# Patient Record
Sex: Female | Born: 1970 | Race: White | Hispanic: No | Marital: Married | Smoking: Never smoker
Health system: Southern US, Community
[De-identification: ages and names within clinical notes are randomized; demographics above are authoritative.]

---

## 2008-12-12 ENCOUNTER — Ambulatory Visit: Payer: Self-pay | Admitting: Family Medicine

## 2015-12-16 ENCOUNTER — Ambulatory Visit (INDEPENDENT_AMBULATORY_CARE_PROVIDER_SITE_OTHER): Payer: BC Managed Care – PPO | Admitting: Family Medicine

## 2015-12-16 VITALS — BP 108/68 | HR 134 | Temp 99.2°F | Resp 12 | Ht <= 58 in | Wt 96.0 lb

## 2015-12-16 DIAGNOSIS — J0100 Acute maxillary sinusitis, unspecified: Secondary | ICD-10-CM

## 2015-12-16 DIAGNOSIS — J209 Acute bronchitis, unspecified: Secondary | ICD-10-CM

## 2015-12-16 MED ORDER — BENZONATATE 200 MG PO CAPS: 200.0000 mg | ORAL_CAPSULE | Freq: Two times a day (BID) | ORAL | Status: DC | PRN

## 2015-12-16 MED ORDER — AMOXICILLIN-POT CLAVULANATE 500-125 MG PO TABS: 1.0000 | ORAL_TABLET | Freq: Two times a day (BID) | ORAL | Status: DC

## 2015-12-16 NOTE — Progress Notes (Signed)
 @  By signing my name below, I, [name], attest that this documentation has been prepared under the direction and in the presence of [name], MD.  Electronically Signed: Andrew Au, ED Scribe. 12/16/2015. 2:17 PM.  Patient ID: Janice Shepherd MRN: [id number], DOB: 01-Apr-1971, 45 y.o. Date of Encounter: 12/16/2015, 2:17 PM  Primary Physician: No primary care provider on file.  Chief Complaint:  Chief Complaint  Patient presents with   Nasal Congestion    pressure forhead for about a week.   HPI: 45 y.o. year old female with history below presents with nasal congestion. Pt states symptoms started with chills and sweats 1 week ago. This week she's developed sinus congestion, sinus pressures and post nasal drip. She states cough is worse at night when lying down. she has been trying to drink plenty of water but states fluids and food taste bad due to symptoms. She reports sick contacts at school.   No past medical history on file.   Home Meds: Prior to Admission medications   Not on File   Allergies: No Known Allergies  Social History   Social History   Marital Status: Married    Spouse Name: N/A   Number of Children: N/A   Years of Education: N/A   Occupational History   Not on file.   Social History Main Topics   Smoking status: Never Smoker    Smokeless tobacco: Not on file   Alcohol Use: No   Drug Use: No   Sexual Activity: Not on file   Other Topics Concern   Not on file   Social History Narrative   No narrative on file    Review of Systems: Constitutional: negative for chills, fever, night sweats, weight changes, or fatigue  HEENT: negative for vision changes, hearing loss, congestion, rhinorrhea, or epistaxis. Cardiovascular: negative for chest pain or palpitations Respiratory: negative for hemoptysis, wheezing, shortness of breath, or cough Abdominal: negative for abdominal pain, nausea, vomiting, diarrhea, or  constipation Dermatological: negative for rash Neurologic: negative for headache, dizziness, or syncope All other systems reviewed and are otherwise negative with the exception to those above and in the HPI.   Physical Exam: Blood pressure 108/68, pulse 134, temperature 99.2 F (37.3 C), resp. rate 12, height  (1.448 m), weight 96 lb (43.545 kg), last menstrual period 12/08/2015, SpO2 98 %., Body mass index is 20.77 kg/(m^2). General: Well developed, well nourished, in no acute distress. Head: Normocephalic, atraumatic, eyes without discharge, sclera non-icteric, nares are without discharge. Bilateral auditory canals clear, TM's are without perforation, pearly grey and translucent with reflective cone of light bilaterally. Oral cavity moist, posterior pharynx without exudate, erythema, peritonsillar abscess, or post nasal drip.  Neck: Supple. No thyromegaly. Full ROM. No lymphadenopathy. Lungs: Clear bilaterally to auscultation without wheezes, rales, or rhonchi. Breathing is unlabored. Heart: RRR with S1 S2. No murmurs, rubs, or gallops appreciated. HR: 110 Msk:  Strength and tone normal for age. Marked scoliosis with convexity to the right.  Extremities/Skin: Warm and dry. No clubbing or cyanosis. No edema. No rashes or suspicious lesions. Neuro: Alert and oriented X 3. Moves all extremities spontaneously. Gait is normal. CNII-XII grossly in tact. Psych:  Responds to questions appropriately with a normal affect.    ASSESSMENT AND PLAN:  45 y.o. year old female with  This chart was scribed in my presence and reviewed by me personally.    ICD-9-CM ICD-10-CM   1. Acute bronchitis, unspecified organism 466.0 J20.9 amoxicillin-clavulanate (AUGMENTIN) 500-125 MG  tablet     benzonatate (TESSALON) 200 MG capsule  2. Acute maxillary sinusitis, recurrence not specified 461.0 J01.00 amoxicillin-clavulanate (AUGMENTIN) 500-125 MG tablet    Signed, [name], MD 12/16/2015 2:17  PM

## 2015-12-16 NOTE — Patient Instructions (Addendum)
Blood pressure recheck 128/72   Sinusitis, Adult Sinusitis is redness, soreness, and inflammation of the paranasal sinuses. Paranasal sinuses are air pockets within the bones of your face. They are located beneath your eyes, in the middle of your forehead, and above your eyes. In healthy paranasal sinuses, mucus is able to drain out, and air is able to circulate through them by way of your nose. However, when your paranasal sinuses are inflamed, mucus and air can become trapped. This can allow bacteria and other germs to grow and cause infection. Sinusitis can develop quickly and last only a short time (acute) or continue over a long period (chronic). Sinusitis that lasts for more than 12 weeks is considered chronic. CAUSES Causes of sinusitis include:  Allergies.  Structural abnormalities, such as displacement of the cartilage that separates your nostrils (deviated septum), which can decrease the air flow through your nose and sinuses and affect sinus drainage.  Functional abnormalities, such as when the small hairs (cilia) that line your sinuses and help remove mucus do not work properly or are not present. SIGNS AND SYMPTOMS Symptoms of acute and chronic sinusitis are the same. The primary symptoms are pain and pressure around the affected sinuses. Other symptoms include:  Upper toothache.  Earache.  Headache.  Bad breath.  Decreased sense of smell and taste.  A cough, which worsens when you are lying flat.  Fatigue.  Fever.  Thick drainage from your nose, which often is green and may contain pus (purulent).  Swelling and warmth over the affected sinuses. DIAGNOSIS Your health care provider will perform a physical exam. During your exam, your health care provider may perform any of the following to help determine if you have acute sinusitis or chronic sinusitis:  Look in your nose for signs of abnormal growths in your nostrils (nasal polyps).  Tap over the affected sinus  to check for signs of infection.  View the inside of your sinuses using an imaging device that has a light attached (endoscope). If your health care provider suspects that you have chronic sinusitis, one or more of the following tests may be recommended:  Allergy tests.  Nasal culture. A sample of mucus is taken from your nose, sent to a lab, and screened for bacteria.  Nasal cytology. A sample of mucus is taken from your nose and examined by your health care provider to determine if your sinusitis is related to an allergy. TREATMENT Most cases of acute sinusitis are related to a viral infection and will resolve on their own within 10 days. Sometimes, medicines are prescribed to help relieve symptoms of both acute and chronic sinusitis. These may include pain medicines, decongestants, nasal steroid sprays, or saline sprays. However, for sinusitis related to a bacterial infection, your health care provider will prescribe antibiotic medicines. These are medicines that will help kill the bacteria causing the infection. Rarely, sinusitis is caused by a fungal infection. In these cases, your health care provider will prescribe antifungal medicine. For some cases of chronic sinusitis, surgery is needed. Generally, these are cases in which sinusitis recurs more than 3 times per year, despite other treatments. HOME CARE INSTRUCTIONS  Drink plenty of water. Water helps thin the mucus so your sinuses can drain more easily.  Use a humidifier.  Inhale steam 3-4 times a day (for example, sit in the bathroom with the shower running).  Apply a warm, moist washcloth to your face 3-4 times a day, or as directed by your health care provider.  Use saline nasal sprays to help moisten and clean your sinuses.  Take medicines only as directed by your health care provider.  If you were prescribed either an antibiotic or antifungal medicine, finish it all even if you start to feel better. SEEK IMMEDIATE MEDICAL  CARE IF:  You have increasing pain or severe headaches.  You have nausea, vomiting, or drowsiness.  You have swelling around your face.  You have vision problems.  You have a stiff neck.  You have difficulty breathing.   This information is not intended to replace advice given to you by your health care provider. Make sure you discuss any questions you have with your health care provider.   Document Released: 10/14/2005 Document Revised: 11/04/2014 Document Reviewed: 10/29/2011 Elsevier Interactive Patient Education 2016 [employer] Acute Bronchitis Bronchitis is inflammation of the airways that extend from the windpipe into the lungs (bronchi). The inflammation often causes mucus to develop. This leads to a cough, which is the most common symptom of bronchitis.  In acute bronchitis, the condition usually develops suddenly and goes away over time, usually in a couple weeks. Smoking, allergies, and asthma can make bronchitis worse. Repeated episodes of bronchitis may cause further lung problems.  CAUSES Acute bronchitis is most often caused by the same virus that causes a cold. The virus can spread from person to person (contagious) through coughing, sneezing, and touching contaminated objects. SIGNS AND SYMPTOMS   Cough.   Fever.   Coughing up mucus.   Body aches.   Chest congestion.   Chills.   Shortness of breath.   Sore throat.  DIAGNOSIS  Acute bronchitis is usually diagnosed through a physical exam. Your health care provider will also ask you questions about your medical history. Tests, such as chest X-rays, are sometimes done to rule out other conditions.  TREATMENT  Acute bronchitis usually goes away in a couple weeks. Oftentimes, no medical treatment is necessary. Medicines are sometimes given for relief of fever or cough. Antibiotic medicines are usually not needed but may be prescribed in certain situations. In some cases, an inhaler may be recommended  to help reduce shortness of breath and control the cough. A cool mist vaporizer may also be used to help thin bronchial secretions and make it easier to clear the chest.  HOME CARE INSTRUCTIONS  Get plenty of rest.   Drink enough fluids to keep your urine clear or pale yellow (unless you have a medical condition that requires fluid restriction). Increasing fluids may help thin your respiratory secretions (sputum) and reduce chest congestion, and it will prevent dehydration.   Take medicines only as directed by your health care provider.  If you were prescribed an antibiotic medicine, finish it all even if you start to feel better.  Avoid smoking and secondhand smoke. Exposure to cigarette smoke or irritating chemicals will make bronchitis worse. If you are a smoker, consider using nicotine gum or skin patches to help control withdrawal symptoms. Quitting smoking will help your lungs heal faster.   Reduce the chances of another bout of acute bronchitis by washing your hands frequently, avoiding people with cold symptoms, and trying not to touch your hands to your mouth, nose, or eyes.   Keep all follow-up visits as directed by your health care provider.  SEEK MEDICAL CARE IF: Your symptoms do not improve after 1 week of treatment.  SEEK IMMEDIATE MEDICAL CARE IF:  You develop an increased fever or chills.   You have chest pain.  You have severe shortness of breath.  You have bloody sputum.   You develop dehydration.  You faint or repeatedly feel like you are going to pass out.  You develop repeated vomiting.  You develop a severe headache. MAKE SURE YOU:   Understand these instructions.  Will watch your condition.  Will get help right away if you are not doing well or get worse.   This information is not intended to replace advice given to you by your health care provider. Make sure you discuss any questions you have with your health care provider.   Document  Released: 11/21/2004 Document Revised: 11/04/2014 Document Reviewed: 04/06/2013 Elsevier Interactive Patient Education [employer]

## 2018-03-21 ENCOUNTER — Ambulatory Visit (HOSPITAL_COMMUNITY)
Admission: EM | Admit: 2018-03-21 | Discharge: 2018-03-21 | Disposition: A | Discharge status: Home / Self Care | Payer: BC Managed Care – PPO | Attending: Family Medicine | Admitting: Family Medicine

## 2018-03-21 ENCOUNTER — Encounter (HOSPITAL_COMMUNITY): Payer: Self-pay | Admitting: Emergency Medicine

## 2018-03-21 DIAGNOSIS — R198 Other specified symptoms and signs involving the digestive system and abdomen: Secondary | ICD-10-CM | POA: Diagnosis not present

## 2018-03-21 DIAGNOSIS — K6289 Other specified diseases of anus and rectum: Secondary | ICD-10-CM

## 2018-03-21 MED ORDER — HYDROCORTISONE 2.5 % RE CREA: TOPICAL_CREAM | RECTAL | 1 refills | Status: DC

## 2018-03-21 NOTE — ED Provider Notes (Signed)
[facility]   [id number] 03/21/18 Arrival Time: 1713   SUBJECTIVE:  Janice Shepherd is a 47 y.o. 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    History reviewed. No pertinent past medical history. History reviewed. No pertinent family history. Social History   Socioeconomic History  . Marital status: Married    Spouse name: Not on file  . Number of children: Not on file  . Years of education: Not on file  . Highest education level: Not on file  Occupational History  . Not on file  Social Needs  . Financial resource strain: Not on file  . Food insecurity:    Worry: Not on file    Inability: Not on file  . Transportation needs:    Medical: Not on file    Non-medical: Not on file  Tobacco Use  . Smoking status: Never Smoker  Substance and Sexual Activity  . Alcohol use: No    Alcohol/week: 0.0 oz  . Drug use: No  . Sexual activity: Not on file  Lifestyle  . Physical activity:    Days per week: Not on file    Minutes per session: Not on file  . Stress: Not on file  Relationships  . Social connections:    Talks on phone: Not on file    Gets together: Not on file    Attends religious service: Not on file    Active member of club or organization: Not on file    Attends meetings of clubs or organizations: Not on file    Relationship status: Not on file  . Intimate partner violence:    Fear of current or ex partner: Not on file    Emotionally abused: Not on file    Physically abused: Not on file    Forced sexual activity: Not on file  Other Topics Concern  . Not on file  Social History Narrative  . Not on file   No outpatient medications have been marked as taking for the 03/21/18 encounter [facility] Encounter).   No Known Allergies    ROS: As per HPI, remainder  of ROS negative.   OBJECTIVE:   Vitals:   03/21/18 1821  BP: (!) 157/99  Pulse: (!) 120  Resp: 18  Temp: 98.5 F (36.9 C)  TempSrc: Oral  SpO2: 100%     General appearance: alert; no distress Eyes: PERRL; EOMI; conjunctiva normal HENT: normocephalic; atraumatic; oral mucosa normal Neck: supple Lungs: clear to auscultation bilaterally Heart: regular rate and rhythm Abdomen: soft, non-tender; bowel sounds normal; no masses or organomegaly; no guarding or rebound tenderness Back: no CVA tenderness Extremities: no cyanosis or edema; symmetrical with no gross deformities Skin: warm and dry Neurologic: normal gait; grossly normal Psychological: alert and cooperative; normal mood and affect      Labs:  No results found for this or any previous visit.  Labs Reviewed - No data to display  No results found.     ASSESSMENT & PLAN:  1. Change in bowel function   2. Proctitis   Take probiotics with each meal for the next week.  Meds ordered this encounter  Medications  . hydrocortisone (ANUSOL-HC) 2.5 % rectal cream    Sig: Apply rectally 2 times daily    Dispense:  28 g    Refill:  1    Reviewed expectations re: course of current medical issues. Questions answered. Outlined signs and symptoms indicating need for more acute intervention. Patient verbalized understanding. After Visit Summary given.    Procedures:      [name], MD 03/22/18 1139

## 2018-03-21 NOTE — ED Triage Notes (Signed)
Pt here for abd pain and gas.

## 2018-03-21 NOTE — Discharge Instructions (Addendum)
Take probiotics with each meal for the next week.

## 2018-05-11 ENCOUNTER — Other Ambulatory Visit: Payer: Self-pay | Admitting: Student

## 2018-05-11 DIAGNOSIS — R140 Abdominal distension (gaseous): Secondary | ICD-10-CM

## 2018-05-21 ENCOUNTER — Ambulatory Visit
Admission: RE | Admit: 2018-05-21 | Discharge: 2018-05-21 | Disposition: A | Discharge status: Home / Self Care | Payer: BC Managed Care – PPO | Source: Ambulatory Visit | Attending: Student | Admitting: Student

## 2018-05-21 DIAGNOSIS — N281 Cyst of kidney, acquired: Secondary | ICD-10-CM | POA: Diagnosis not present

## 2018-05-21 DIAGNOSIS — R140 Abdominal distension (gaseous): Secondary | ICD-10-CM | POA: Diagnosis not present

## 2018-11-14 ENCOUNTER — Ambulatory Visit (HOSPITAL_COMMUNITY)
Admission: EM | Admit: 2018-11-14 | Discharge: 2018-11-14 | Disposition: A | Discharge status: Home / Self Care | Payer: BC Managed Care – PPO | Attending: Family Medicine | Admitting: Family Medicine

## 2018-11-14 ENCOUNTER — Encounter (HOSPITAL_COMMUNITY): Payer: Self-pay | Admitting: Emergency Medicine

## 2018-11-14 DIAGNOSIS — R059 Cough, unspecified: Secondary | ICD-10-CM

## 2018-11-14 DIAGNOSIS — R0981 Nasal congestion: Secondary | ICD-10-CM | POA: Insufficient documentation

## 2018-11-14 DIAGNOSIS — R05 Cough: Secondary | ICD-10-CM | POA: Insufficient documentation

## 2018-11-14 DIAGNOSIS — R030 Elevated blood-pressure reading, without diagnosis of hypertension: Secondary | ICD-10-CM | POA: Insufficient documentation

## 2018-11-14 MED ORDER — BENZONATATE 100 MG PO CAPS: ORAL_CAPSULE | ORAL | 0 refills | Status: DC

## 2018-11-14 MED ORDER — AZITHROMYCIN 250 MG PO TABS: 250.0000 mg | ORAL_TABLET | Freq: Every day | ORAL | 0 refills | Status: DC

## 2018-11-14 NOTE — ED Triage Notes (Signed)
Pt c/o coughing, congestion, pressure in head x1 week.

## 2018-11-16 NOTE — ED Provider Notes (Signed)
[facility]   [id number] 11/14/18 Arrival Time: 1403  ASSESSMENT & PLAN:  1. Cough   2. Sinus congestion   3. Elevated blood pressure reading in office without diagnosis of hypertension     Meds ordered this encounter  Medications  . azithromycin (ZITHROMAX) 250 MG tablet    Sig: Take 1 tablet (250 mg total) by mouth daily. Take first 2 tablets together, then 1 every day until finished.    Dispense:  6 tablet    Refill:  0  . benzonatate (TESSALON) 100 MG capsule    Sig: Take 1 capsule by mouth every 8 (eight) hours for cough.    Dispense:  21 capsule    Refill:  0   Clinical Course  Sat Nov 14, 2018  1559 Noted. Plans follow up with PCP to recheck. Recheck 143/83.  BP(!): 188/104 [BH]  1610 Pulse Rate(!): 105 [BH]     Written HTN information given.  OTC symptom care as needed. Ensure adequate fluid intake and rest.  Reviewed expectations re: course of current medical issues. Questions answered. Outlined signs and symptoms indicating need for more acute intervention. Patient verbalized understanding. After Visit Summary given.   SUBJECTIVE: History from: patient.  Janice Shepherd is a 48 y.o. female who presents with complaint of nasal congestion, post-nasal drainage, and sinus pain. Also with a mild non-productive cough. Onset gradual, 2 weeks ago; worse over the past week. Respiratory symptoms: cough without wheezing/SOB. Fever: no. Overall normal PO intake without n/v. OTC treatment: cold medicines without much help. Now with more facial/sinus pressure. History of frequent sinus infections: no. No specific aggravating or alleviating factors reported.  Social History   Tobacco Use  Smoking Status Never Smoker   ROS: As per HPI. All other systems negative    OBJECTIVE:  Vitals:   11/14/18 1539  BP: (!) 188/104  Pulse: (!) 105  Resp: 16  Temp: 98.5 F (36.9 C)  SpO2: 100%    Elevated BP noted. Recheck 143/96.  General appearance: alert;  appears fatigued HEENT: nasal congestion; clear runny nose; throat irritation secondary to post-nasal drainage; bilateral maxillary, frontal tenderness to palpation; turbinates boggy Neck: supple without LAD; trachea midline Lungs: unlabored respirations, symmetrical air entry without wheezing; cough: moderate; no respiratory distress Skin: warm and dry Psychological: alert and cooperative; normal mood and affect  No Known Allergies  FH: Questions HTN.  Social History   Socioeconomic History  . Marital status: Married    Spouse name: Not on file  . Number of children: Not on file  . Years of education: Not on file  . Highest education level: Not on file  Occupational History  . Not on file  Social Needs  . Financial resource strain: Not on file  . Food insecurity:    Worry: Not on file    Inability: Not on file  . Transportation needs:    Medical: Not on file    Non-medical: Not on file  Tobacco Use  . Smoking status: Never Smoker  Substance and Sexual Activity  . Alcohol use: No    Alcohol/week: 0.0 standard drinks  . Drug use: No  . Sexual activity: Not on file  Lifestyle  . Physical activity:    Days per week: Not on file    Minutes per session: Not on file  . Stress: Not on file  Relationships  . Social connections:    Talks on phone: Not on file    Gets together: Not on file  Attends religious service: Not on file    Active member of club or organization: Not on file    Attends meetings of clubs or organizations: Not on file    Relationship status: Not on file  . Intimate partner violence:    Fear of current or ex partner: Not on file    Emotionally abused: Not on file    Physically abused: Not on file    Forced sexual activity: Not on file  Other Topics Concern  . Not on file  Social History Narrative  . Not on file            Mardella LaymanHagler, Marlin Brys, MD 11/16/18 [phone]

## 2019-01-02 IMAGING — US US ABDOMEN COMPLETE
1 series · 14 of 25 positions shown · non-contrast
Comparison: None.

CLINICAL DATA: Two months of abdominal bloating.

EXAM:
ABDOMEN ULTRASOUND COMPLETE

[Series 1: us abdomen complete · 14 of 105 slices shown]
[im 1/105]
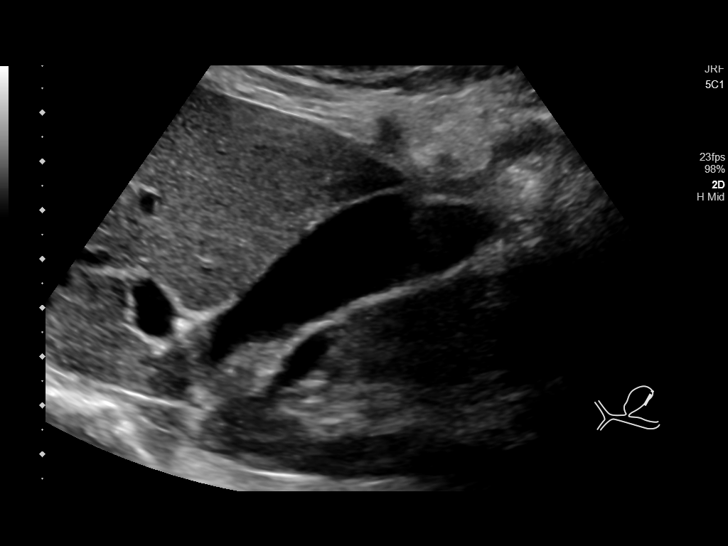
[im 9/105]
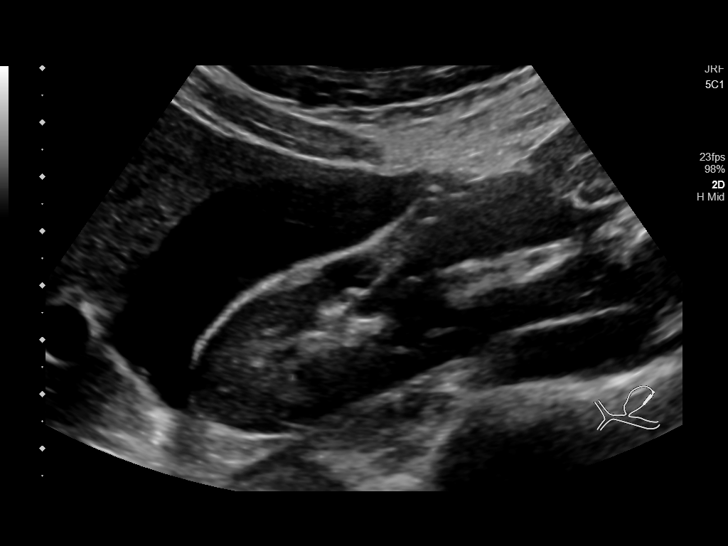
[im 18/105]
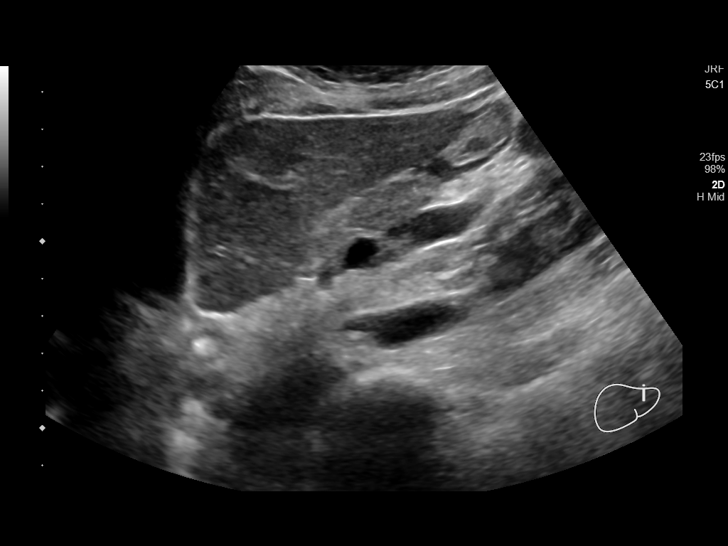
[im 27/105]
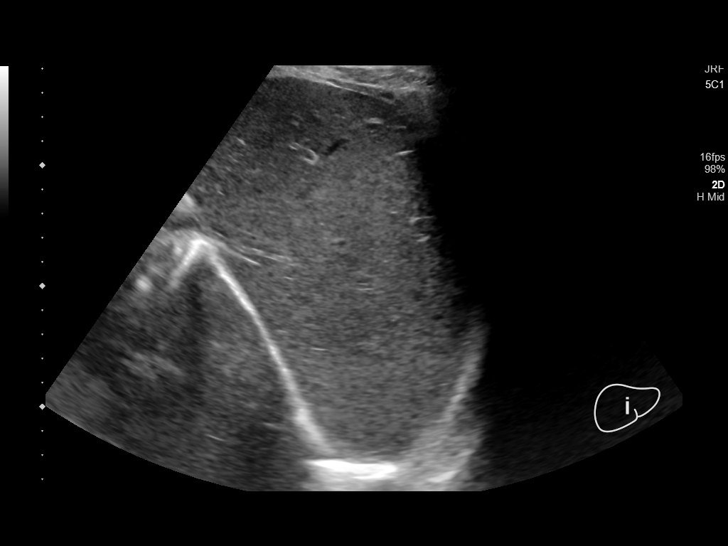
[im 35/105]
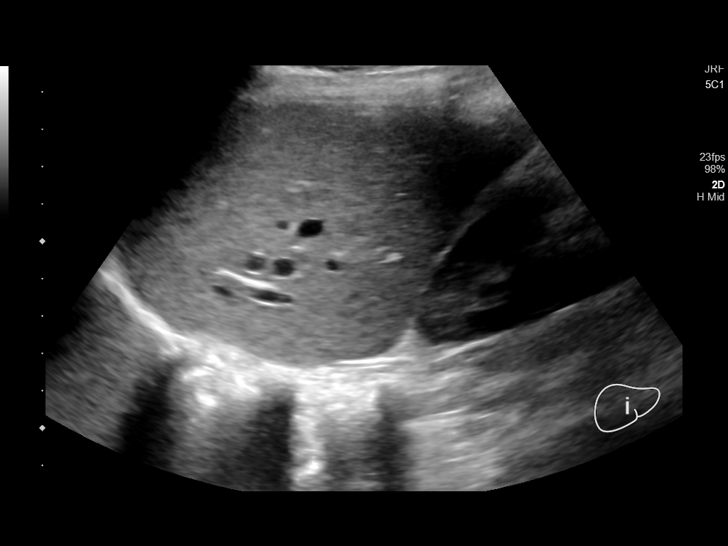
[im 40/105]
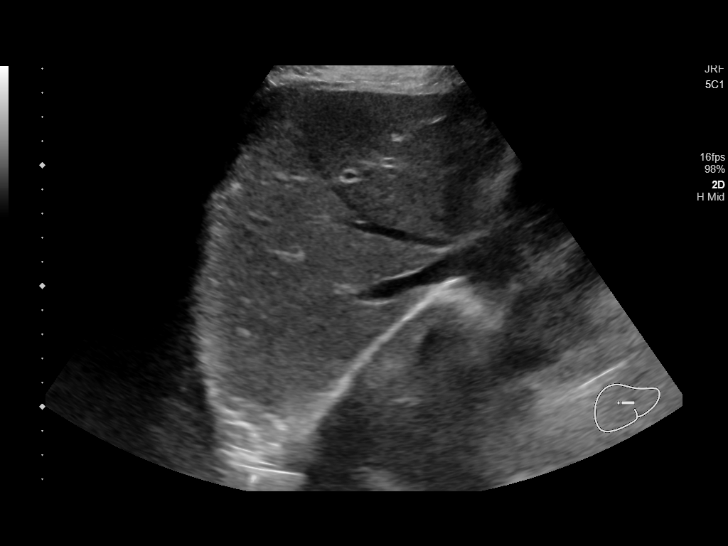
[im 48/105]
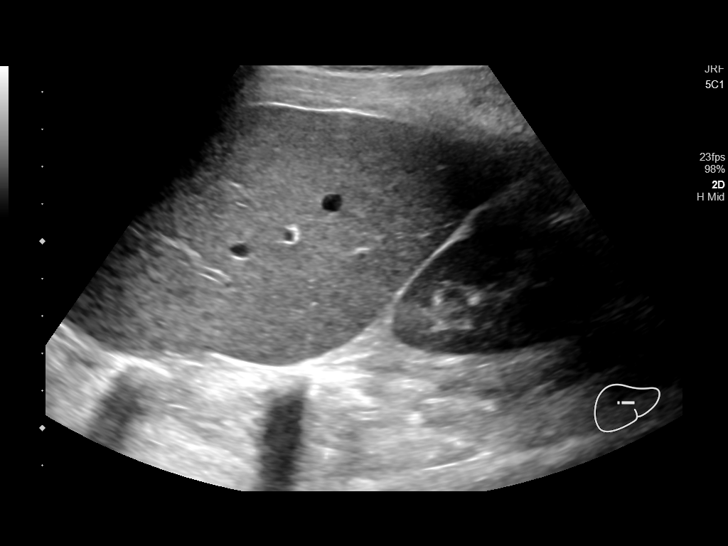
[im 57/105]
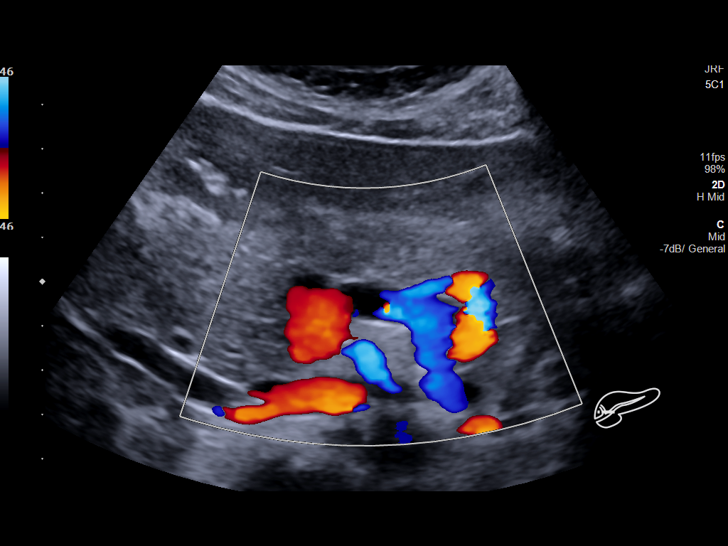
[im 66/105]
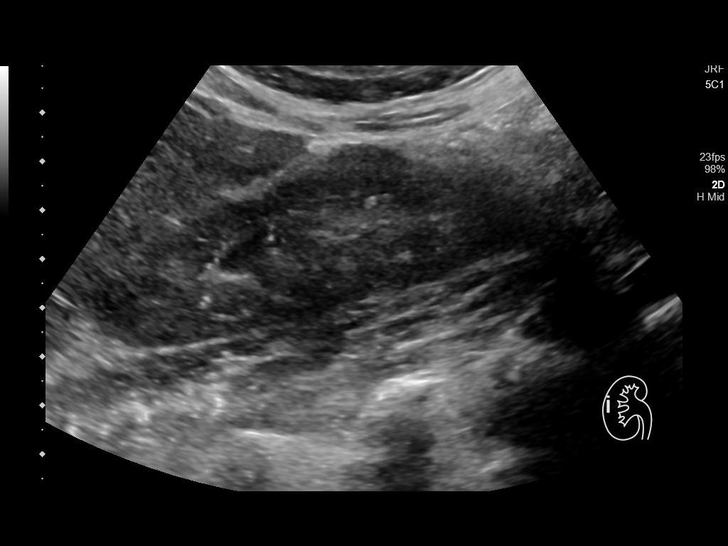
[im 70/105]
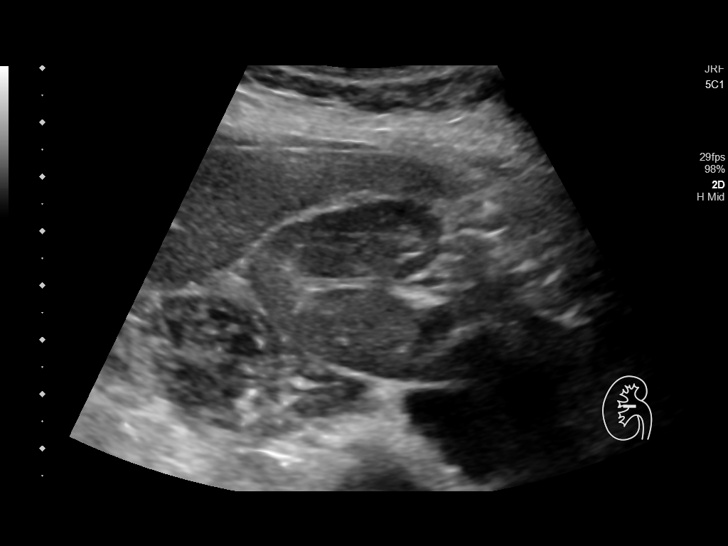
[im 79/105]
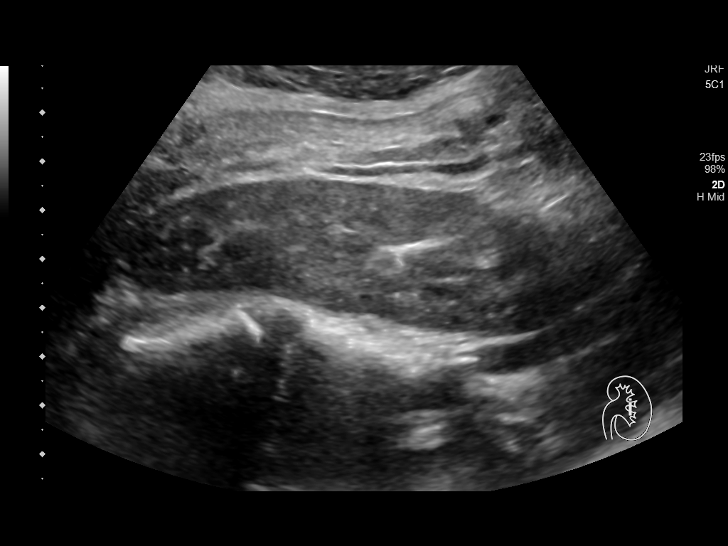
[im 87/105]
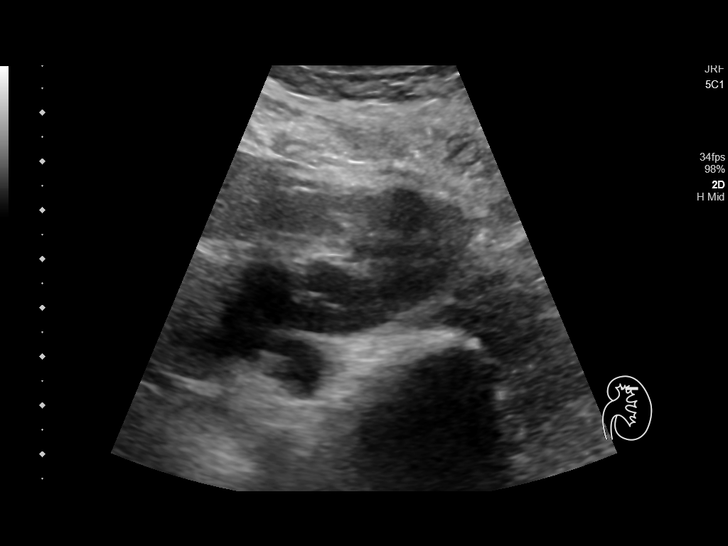
[im 96/105]
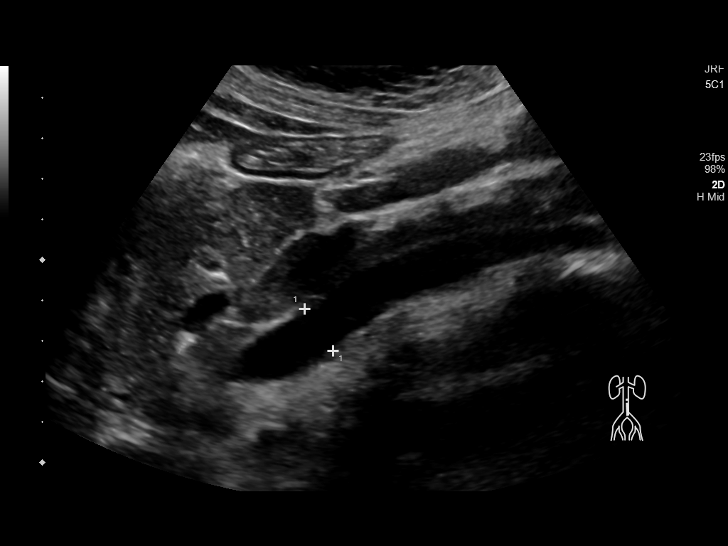
[im 105/105]
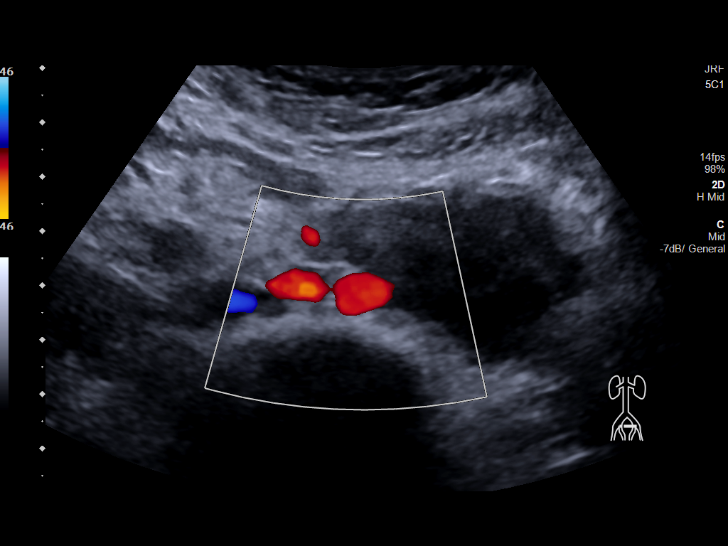

[14 of 25 positions shown; findings below may reference images not displayed]

FINDINGS: Gallbladder: No gallstones or wall thickening visualized. No
sonographic Murphy sign noted by sonographer.

Common bile duct: Diameter: 1.9 mm

Liver: No focal lesion identified. Within normal limits in
parenchymal echogenicity. Portal vein is patent on color Doppler
imaging with normal direction of blood flow towards the liver.

IVC: No abnormality visualized.

Pancreas: Visualized portion unremarkable.

Spleen: Size and appearance within normal limits.

Right Kidney: Length: 10.2 cm. There is a lower pole cyst measuring
2.4 cm in diameter. The renal cortical echotexture remains lower
than that of the liver.

Left Kidney: Length: 9.6 cm. Echogenicity within normal limits. No
mass or hydronephrosis visualized.

Abdominal aorta: No aneurysm visualized.

Other findings: There is no ascites.
IMPRESSION: There is no acute intra-abdominal abnormality. There is a simple
appearing lower pole cyst in the right kidney.

## 2019-06-09 ENCOUNTER — Ambulatory Visit: Payer: Self-pay | Admitting: Physician Assistant

## 2019-10-17 ENCOUNTER — Ambulatory Visit (HOSPITAL_COMMUNITY)
Admission: EM | Admit: 2019-10-17 | Discharge: 2019-10-17 | Disposition: A | Discharge status: Home / Self Care | Payer: BC Managed Care – PPO | Attending: Family Medicine | Admitting: Family Medicine

## 2019-10-17 ENCOUNTER — Other Ambulatory Visit: Payer: Self-pay

## 2019-10-17 ENCOUNTER — Encounter (HOSPITAL_COMMUNITY): Payer: Self-pay

## 2019-10-17 DIAGNOSIS — L219 Seborrheic dermatitis, unspecified: Secondary | ICD-10-CM

## 2019-10-17 MED ORDER — KETOCONAZOLE 2 % EX SHAM: 1.0000 "application " | MEDICATED_SHAMPOO | CUTANEOUS | 0 refills | Status: DC

## 2019-10-17 NOTE — ED Triage Notes (Signed)
Pt states she has some type of scaple issue in the back of her head. This has been going on since last Wednesday , red and burns some.

## 2019-10-17 NOTE — Discharge Instructions (Signed)
Use medicated shampoo 2 x a week May also use a hydrocortisone scalp treatment for itching if needed (scalpicin)  See your PCP if fails to improve

## 2019-10-17 NOTE — ED Provider Notes (Signed)
[place]    CSN: [id number] Arrival date & time: 10/17/19  1116      History   Chief Complaint Chief Complaint  Patient presents with  . Rash    HPI Janice Shepherd is a 48 y.o. female.   HPI  Patient has a rash on her scalp.  It is itching.  She has red bumps and scale.  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  She thinks the children may have given her an infection or infestation.  History reviewed. No pertinent past medical history.  There are no problems to display for this patient.   History reviewed. No pertinent surgical history.  OB History   No obstetric history on file.      Home Medications    Prior to Admission medications   Medication Sig Start Date End Date Taking? Authorizing Provider  ketoconazole (NIZORAL) 2 % shampoo Apply 1 application topically 2 (two) times a week. 10/18/19   Raylene Everts, MD    Family History History reviewed. No pertinent family history.  Social History Social History   Tobacco Use  . Smoking status: Never Smoker  . Smokeless tobacco: Never Used  Substance Use Topics  . Alcohol use: No    Alcohol/week: 0.0 standard drinks  . Drug use: No     Allergies   Patient has no known allergies.   Review of Systems Review of Systems  Constitutional: Negative for chills and fever.  HENT: Negative for congestion and hearing loss.   Eyes: Negative for pain.  Respiratory: Negative for cough and shortness of breath.   Cardiovascular: Negative for chest pain and leg swelling.  Gastrointestinal: Negative for abdominal pain, constipation and diarrhea.  Genitourinary: Negative for dysuria and frequency.  Musculoskeletal: Negative for myalgias.  Skin: Positive for rash.  Neurological: Negative for dizziness, seizures and headaches.  Psychiatric/Behavioral: The patient is not nervous/anxious.      Physical Exam Triage Vital Signs ED Triage Vitals    Enc Vitals Group     BP 10/17/19 1156 (!) 158/84     Pulse Rate 10/17/19 1156 100     Resp 10/17/19 1156 18     Temp 10/17/19 1156 98.3 F (36.8 C)     Temp src --      SpO2 10/17/19 1156 100 %     Weight 10/17/19 1154 104 lb (47.2 kg)     Height --      Head Circumference --      Peak Flow --      Pain Score 10/17/19 1153 0     Pain Loc --      Pain Edu? --      Excl. in [place]? --    No data found.  Updated Vital Signs BP (!) 158/84   Pulse 100   Temp 98.3 F (36.8 C)   Resp 18   Wt 47.2 kg   LMP 09/26/2019   SpO2 100%   BMI 22.51 kg/m      Physical Exam Constitutional:      General: She is not in acute distress.    Appearance: She is well-developed and normal weight.  HENT:     Head: Normocephalic and atraumatic.     Mouth/Throat:     Comments: Mask in place Eyes:     Conjunctiva/sclera: Conjunctivae normal.     Pupils: Pupils are equal, round, and reactive to light.  Cardiovascular:  Rate and Rhythm: Normal rate and regular rhythm.  Pulmonary:     Effort: Pulmonary effort is normal. No respiratory distress.     Breath sounds: Normal breath sounds.  Abdominal:     General: There is no distension.     Palpations: Abdomen is soft.  Musculoskeletal:        General: Normal range of motion.     Cervical back: Normal range of motion.  Skin:    General: Skin is warm and dry.     Comments: Scalp was examined.  There is red small macules around the eye back of the occiput region with scale, scattered up towards the crown  Neurological:     Mental Status: She is alert.      UC Treatments / Results  Labs (all labs ordered are listed, but only abnormal results are displayed) Labs Reviewed - No data to display  EKG   Radiology No results found.  Procedures Procedures (including critical care time)  Medications Ordered in UC Medications - No data to display  Initial Impression / Assessment and Plan / UC Course  I have reviewed the triage vital  signs and the nursing notes.  Pertinent labs & imaging results that were available during my care of the patient were reviewed by me and considered in my medical decision making (see chart for details).     I reassured the patient that this is noninfectious Final Clinical Impressions(s) / UC Diagnoses   Final diagnoses:  Seborrheic dermatitis of scalp     Discharge Instructions     Use medicated shampoo 2 x a week May also use a hydrocortisone scalp treatment for itching if needed (scalpicin)  See your PCP if fails to improve    ED Prescriptions    Medication Sig Dispense Auth. Provider   ketoconazole (NIZORAL) 2 % shampoo  (Status: Discontinued) Apply 1 application topically 2 (two) times a week. 120 mL Eustace Moore, MD   ketoconazole (NIZORAL) 2 % shampoo Apply 1 application topically 2 (two) times a week. 120 mL Eustace Moore, MD     PDMP not reviewed this encounter.   Eustace Moore, MD 10/17/19 [phone]

## 2019-11-10 ENCOUNTER — Other Ambulatory Visit: Payer: Self-pay | Admitting: Family Medicine

## 2019-11-10 ENCOUNTER — Other Ambulatory Visit: Payer: Self-pay

## 2019-11-10 ENCOUNTER — Ambulatory Visit: Payer: Self-pay

## 2019-11-10 DIAGNOSIS — S0033XA Contusion of nose, initial encounter: Secondary | ICD-10-CM

## 2019-12-13 ENCOUNTER — Encounter (HOSPITAL_COMMUNITY): Payer: Self-pay

## 2019-12-13 ENCOUNTER — Ambulatory Visit (HOSPITAL_COMMUNITY)
Admission: EM | Admit: 2019-12-13 | Discharge: 2019-12-13 | Disposition: A | Discharge status: Home / Self Care | Payer: BC Managed Care – PPO | Attending: Family Medicine | Admitting: Family Medicine

## 2019-12-13 ENCOUNTER — Other Ambulatory Visit: Payer: Self-pay

## 2019-12-13 DIAGNOSIS — H60509 Unspecified acute noninfective otitis externa, unspecified ear: Secondary | ICD-10-CM

## 2019-12-13 DIAGNOSIS — J3489 Other specified disorders of nose and nasal sinuses: Secondary | ICD-10-CM

## 2019-12-13 MED ORDER — NEOMYCIN-POLYMYXIN-HC 3.5-10000-1 OT SUSP: 4.0000 [drp] | Freq: Three times a day (TID) | OTIC | 0 refills | Status: DC

## 2019-12-13 MED ORDER — FLUTICASONE PROPIONATE 50 MCG/ACT NA SUSP: 2.0000 | Freq: Every day | NASAL | 2 refills | Status: DC

## 2019-12-13 NOTE — ED Triage Notes (Signed)
Pt state she has itching in her ears and some pressure. X 4 days.

## 2019-12-13 NOTE — Discharge Instructions (Addendum)
Use the nasal spray once a day.  Continue until your sinus symptoms are improved Use the eardrops 3 times a day until your ear pain goes away Follow-up with your primary care doctor

## 2019-12-13 NOTE — ED Provider Notes (Signed)
[place]    CSN: [id number] Arrival date & time: 12/13/19  1421      History   Chief Complaint Chief Complaint  Patient presents with  . Otalgia    Language Albanian    HPI Norman Bier is a 49 y.o. female.   HPI Patient is here for ear itching, ear popping and pain.  Sinus pressure.  Runny stuffy nose.  Postnasal drip.  No sore throat.  No fever or chills.  No coughing or chest congestion.  No fever or chills.  No known exposure to Covid.  She thinks it is her "sinus".  Worried about allergies.  No purulent drainage. History reviewed. No pertinent past medical history.  There are no problems to display for this patient.   History reviewed. No pertinent surgical history.  OB History   No obstetric history on file.      Home Medications    Prior to Admission medications   Medication Sig Start Date End Date Taking? Authorizing Provider  fluticasone (FLONASE) 50 MCG/ACT nasal spray Place 2 sprays into both nostrils daily. 12/13/19   Raylene Everts, MD  ketoconazole (NIZORAL) 2 % shampoo Apply 1 application topically 2 (two) times a week. 10/18/19   Raylene Everts, MD  neomycin-polymyxin-hydrocortisone (CORTISPORIN) 3.5-10000-1 OTIC suspension Place 4 drops into both ears 3 (three) times daily. Until ear pain improves 12/13/19   Raylene Everts, MD    Family History History reviewed. No pertinent family history.  Social History Social History   Tobacco Use  . Smoking status: Never Smoker  . Smokeless tobacco: Never Used  Substance Use Topics  . Alcohol use: No    Alcohol/week: 0.0 standard drinks  . Drug use: No     Allergies   Patient has no known allergies.   Review of Systems Review of Systems  Constitutional: Negative for chills, fatigue and fever.  HENT: Positive for congestion, ear pain, postnasal drip, rhinorrhea and sinus pain. Negative for hearing loss.   Respiratory: Negative for cough and shortness of breath.     Gastrointestinal: Negative for nausea and vomiting.     Physical Exam Triage Vital Signs ED Triage Vitals  Enc Vitals Group     BP 12/13/19 1515 (!) 166/99     Pulse Rate 12/13/19 1515 65     Resp 12/13/19 1515 16     Temp 12/13/19 1515 98.2 F (36.8 C)     Temp src --      SpO2 12/13/19 1515 100 %     Weight 12/13/19 1514 100 lb 9.6 oz (45.6 kg)     Height --      Head Circumference --      Peak Flow --      Pain Score 12/13/19 1616 2     Pain Loc --      Pain Edu? --      Excl. in [place]? --    No data found.  Updated Vital Signs BP (!) 166/99 (BP Location: Right Arm)   Pulse 65   Temp 98.2 F (36.8 C)   Resp 16   Wt 45.6 kg   LMP 11/19/2019   SpO2 100%   BMI 21.77 kg/m      Physical Exam Constitutional:      General: She is not in acute distress.    Appearance: Normal appearance. She is well-developed and normal weight.  HENT:     Head: Normocephalic and atraumatic.     Right Ear: Tympanic  membrane, ear canal and external ear normal.     Left Ear: Tympanic membrane, ear canal and external ear normal.     Ears:     Comments: Mild tenderness over maxillary sinus area.    Nose: Congestion and rhinorrhea present.     Mouth/Throat:     Mouth: Mucous membranes are moist.     Pharynx: No posterior oropharyngeal erythema.  Eyes:     Conjunctiva/sclera: Conjunctivae normal.     Pupils: Pupils are equal, round, and reactive to light.  Cardiovascular:     Rate and Rhythm: Normal rate.  Pulmonary:     Effort: Pulmonary effort is normal. No respiratory distress.  Musculoskeletal:        General: Normal range of motion.     Cervical back: Normal range of motion.  Skin:    General: Skin is warm and dry.  Neurological:     Mental Status: She is alert.  Psychiatric:        Mood and Affect: Mood normal.        Behavior: Behavior normal.      UC Treatments / Results  Labs (all labs ordered are listed, but only abnormal results are displayed) Labs Reviewed -  No data to display  EKG   Radiology No results found.  Procedures Procedures (including critical care time)  Medications Ordered in UC Medications - No data to display  Initial Impression / Assessment and Plan / UC Course  I have reviewed the triage vital signs and the nursing notes.  Pertinent labs & imaging results that were available during my care of the patient were reviewed by me and considered in my medical decision making (see chart for details).     Patient is predominantly having allergy symptoms.  We will add Flonase and eardrops for comfort. Final Clinical Impressions(s) / UC Diagnoses   Final diagnoses:  Acute otitis externa, unspecified laterality, unspecified type  Sinus pressure     Discharge Instructions     Use the nasal spray once a day.  Continue until your sinus symptoms are improved Use the eardrops 3 times a day until your ear pain goes away Follow-up with your primary care doctor   ED Prescriptions    Medication Sig Dispense Auth. Provider   fluticasone (FLONASE) 50 MCG/ACT nasal spray Place 2 sprays into both nostrils daily. 16 g Eustace Moore, MD   neomycin-polymyxin-hydrocortisone (CORTISPORIN) 3.5-10000-1 OTIC suspension Place 4 drops into both ears 3 (three) times daily. Until ear pain improves 10 mL Eustace Moore, MD     PDMP not reviewed this encounter.   Eustace Moore, MD 12/13/19 2011

## 2020-06-23 IMAGING — DX DG NASAL BONES 3+V
3 series · 3 of 3 positions shown · non-contrast
Comparison: None.

CLINICAL DATA: Nasal injury yesterday.

EXAM:
NASAL BONES - 3+ VIEW

[skull waters]
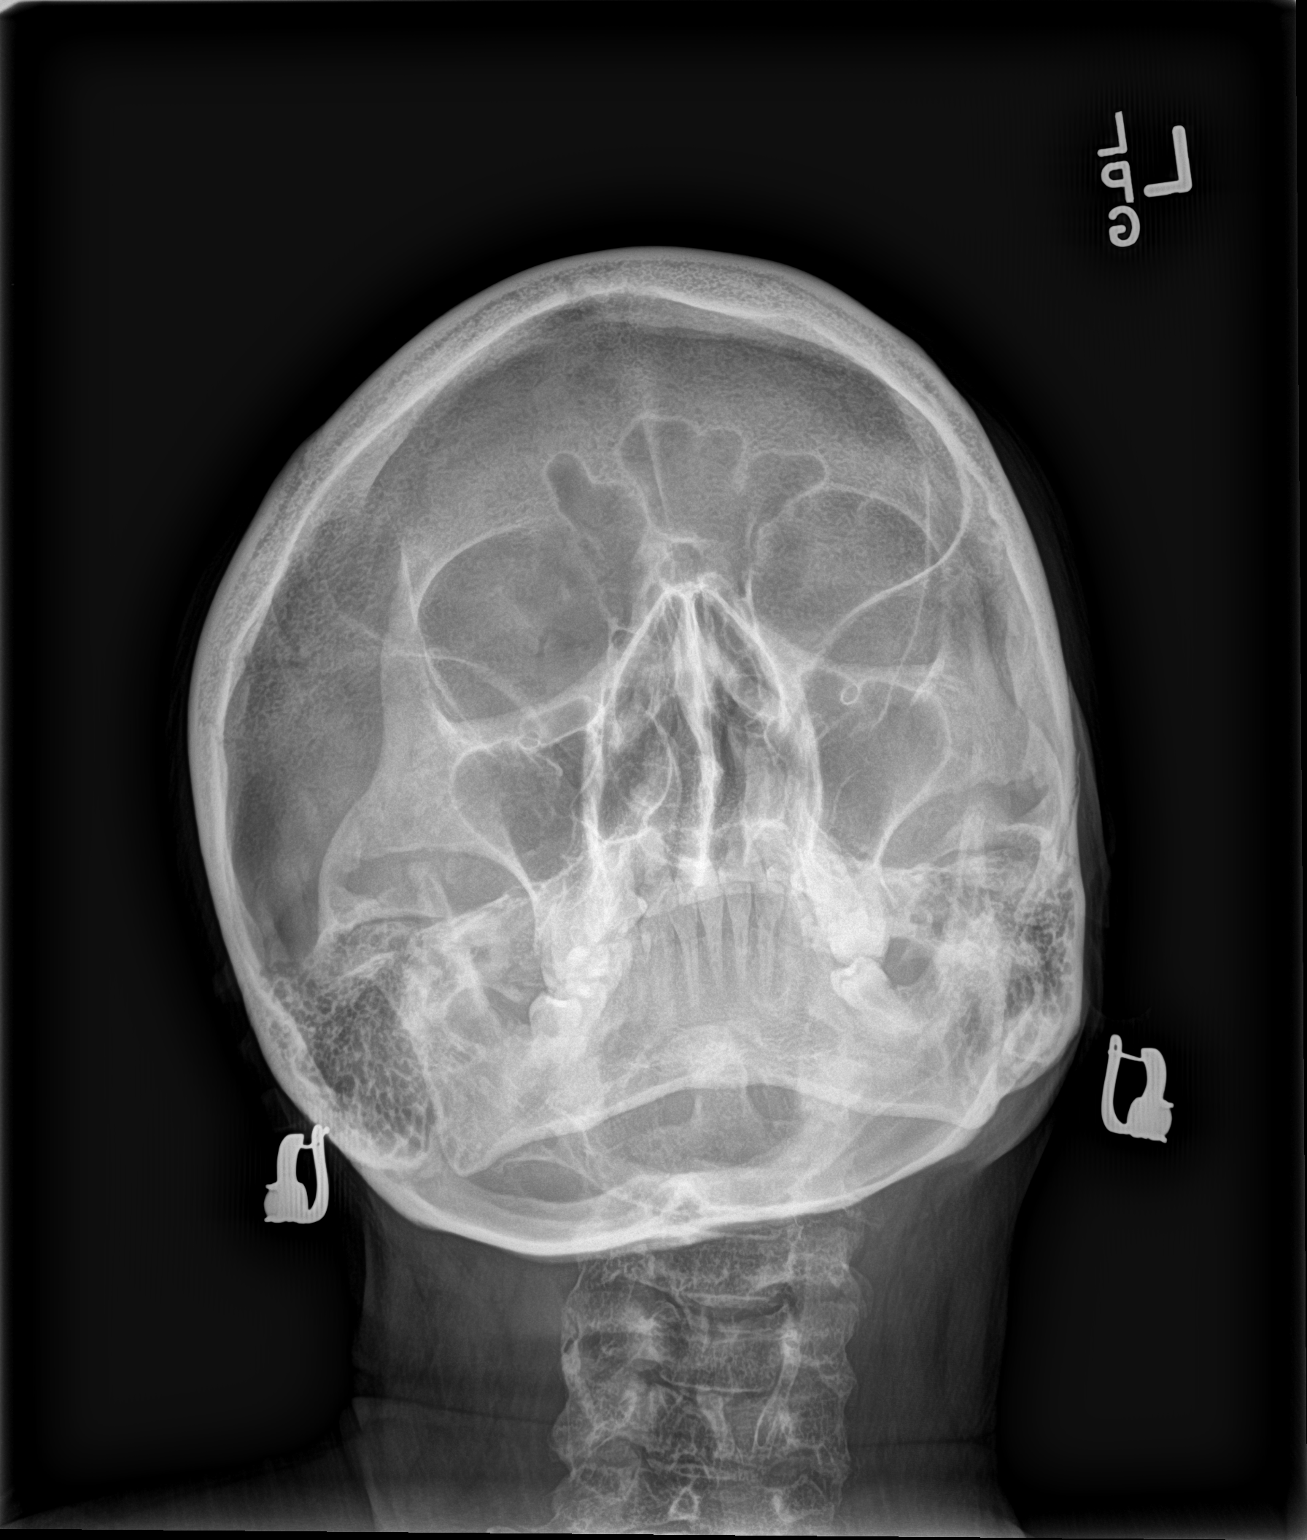

[nasal lat (1 of 2)]
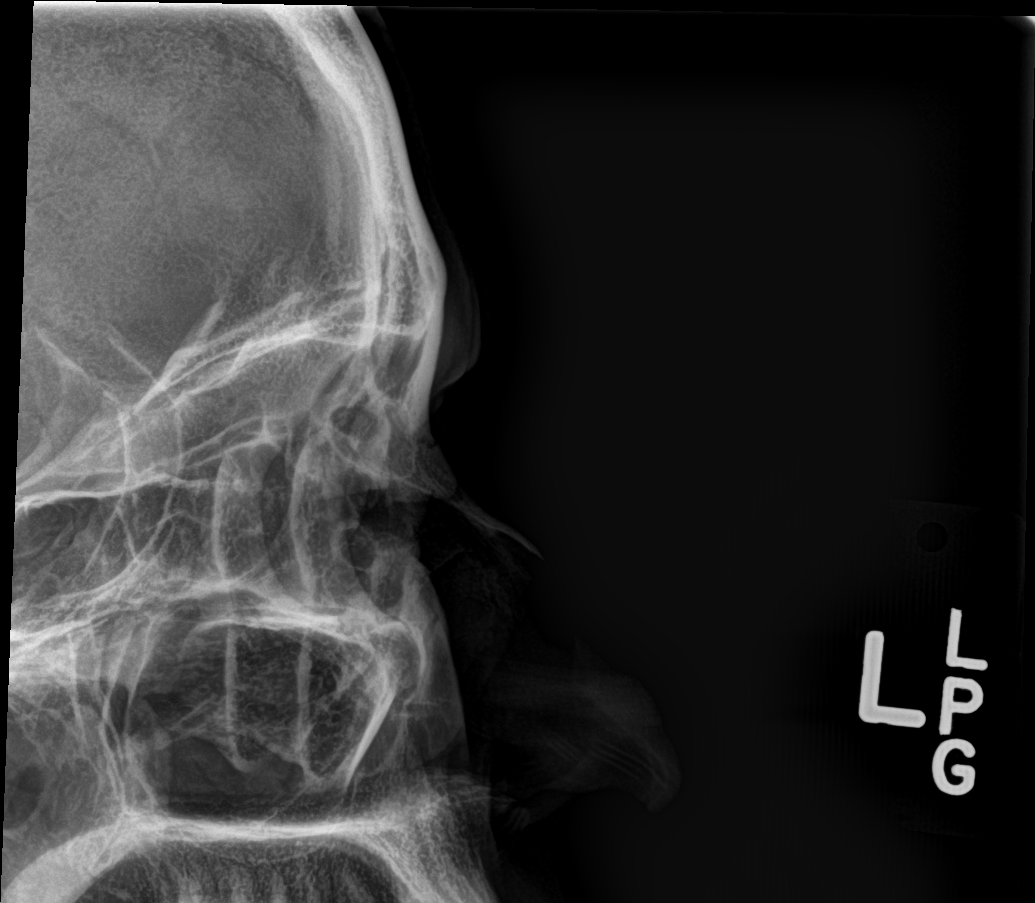

[nasal lat (2 of 2)]
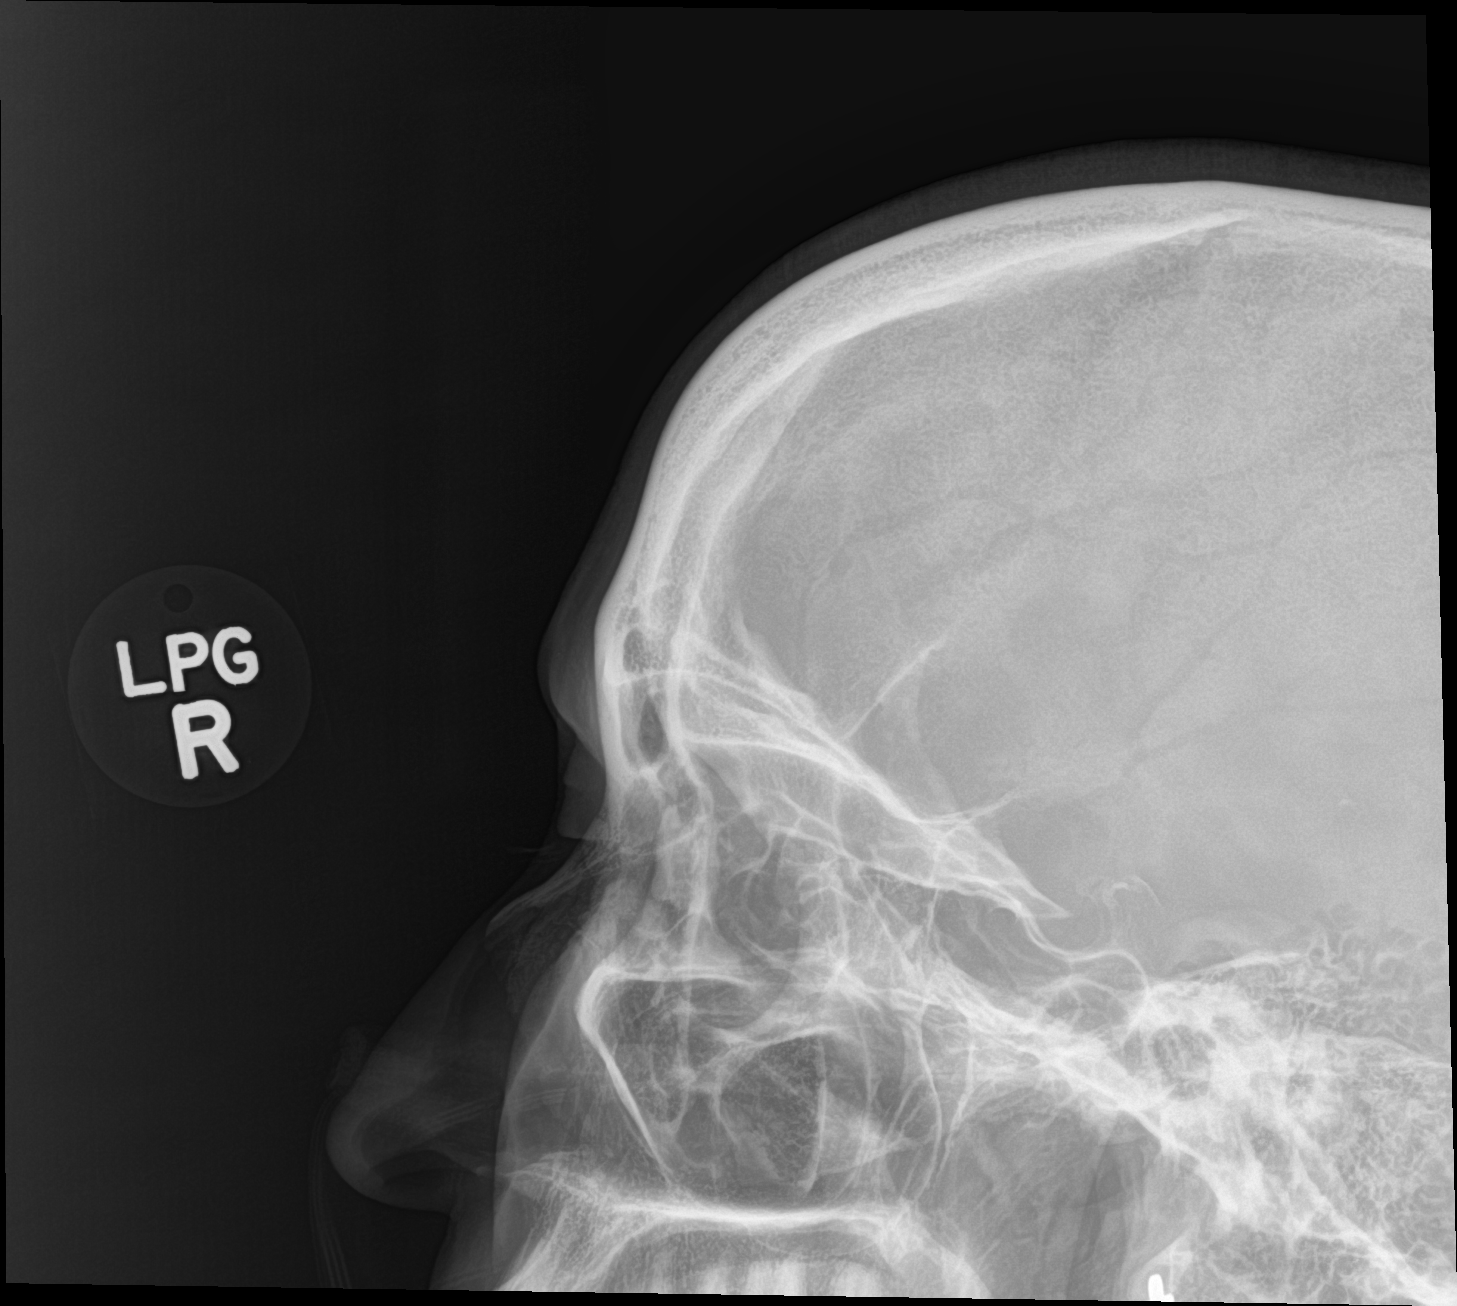

[3 of 3 positions shown; findings below may reference images not displayed]

FINDINGS: No evidence of displaced nasal bone fracture. The nasal process of
the maxilla is intact. The visualized paranasal sinuses are clear
without air-fluid levels. No orbital abnormalities.
IMPRESSION: No evidence of displaced nasal bone fracture.

## 2020-10-14 ENCOUNTER — Other Ambulatory Visit: Payer: Self-pay

## 2020-10-14 ENCOUNTER — Ambulatory Visit (HOSPITAL_COMMUNITY)
Admission: EM | Admit: 2020-10-14 | Discharge: 2020-10-14 | Disposition: A | Discharge status: Home / Self Care | Payer: BC Managed Care – PPO | Attending: Emergency Medicine | Admitting: Emergency Medicine

## 2020-10-14 ENCOUNTER — Encounter (HOSPITAL_COMMUNITY): Payer: Self-pay

## 2020-10-14 DIAGNOSIS — H60501 Unspecified acute noninfective otitis externa, right ear: Secondary | ICD-10-CM

## 2020-10-14 MED ORDER — OFLOXACIN 0.3 % OT SOLN: 10.0000 [drp] | Freq: Every day | OTIC | 0 refills | Status: DC

## 2020-10-14 NOTE — ED Triage Notes (Signed)
Pt presents with right ear pain x 3 days. States is itchy and burning. Denies drainage, fever, chills.

## 2020-10-14 NOTE — Discharge Instructions (Signed)
Use the ear drops as directed.  Follow up with your primary care provider if your symptoms are not improving.    

## 2020-10-14 NOTE — ED Provider Notes (Signed)
[facility]    CSN: [id number] Arrival date & time: 10/14/20  1412      History   Chief Complaint Chief Complaint  Patient presents with  . Otalgia    HPI Janice Shepherd is a 49 y.o. female.  Patient presents with 3-day history of right ear pain. She also reports some drainage. She denies fever, chills, sore throat, cough, or other symptoms. Treatment attempted at home with out of date eardrops.  The history is provided by the patient and medical records.    History reviewed. No pertinent past medical history.  There are no problems to display for this patient.   History reviewed. No pertinent surgical history.  OB History   No obstetric history on file.      Home Medications    Prior to Admission medications   Medication Sig Start Date End Date Taking? Authorizing Provider  fluticasone (FLONASE) 50 MCG/ACT nasal spray Place 2 sprays into both nostrils daily. 12/13/19   Eustace Moore, MD  ketoconazole (NIZORAL) 2 % shampoo Apply 1 application topically 2 (two) times a week. 10/18/19   Eustace Moore, MD  ofloxacin (FLOXIN) 0.3 % OTIC solution Place 10 drops into the right ear daily. 10/14/20   Mickie Bail, NP    Family History History reviewed. No pertinent family history.  Social History Social History   Tobacco Use  . Smoking status: Never Smoker  . Smokeless tobacco: Never Used  Substance Use Topics  . Alcohol use: No    Alcohol/week: 0.0 standard drinks  . Drug use: No     Allergies   Patient has no known allergies.   Review of Systems Review of Systems  Constitutional: Negative for chills and fever.  HENT: Positive for ear discharge and ear pain. Negative for sore throat.   Eyes: Negative for pain and visual disturbance.  Respiratory: Negative for cough and shortness of breath.   Cardiovascular: Negative for chest pain and palpitations.  Gastrointestinal: Negative for abdominal pain and vomiting.  Genitourinary:  Negative for dysuria and hematuria.  Musculoskeletal: Negative for arthralgias and back pain.  Skin: Negative for color change and rash.  Neurological: Negative for seizures and syncope.  All other systems reviewed and are negative.    Physical Exam Triage Vital Signs ED Triage Vitals  Enc Vitals Group     BP      Pulse      Resp      Temp      Temp src      SpO2      Weight      Height      Head Circumference      Peak Flow      Pain Score      Pain Loc      Pain Edu?      Excl. in [place]?    No data found.  Updated Vital Signs BP (!) 150/87 (BP Location: Right Arm)   Pulse (!) 103   Temp 98.3 F (36.8 C) (Oral)   Resp 17   LMP  (Within Weeks) Comment: 1 week   SpO2 100%   Visual Acuity Right Eye Distance:   Left Eye Distance:   Bilateral Distance:    Right Eye Near:   Left Eye Near:    Bilateral Near:     Physical Exam Vitals and nursing note reviewed.  Constitutional:      General: She is not in acute distress.    Appearance: She  is well-developed and well-nourished.  HENT:     Head: Normocephalic and atraumatic.     Right Ear: Drainage and tenderness present.     Left Ear: Tympanic membrane and ear canal normal.     Ears:     Comments: Unable to visualize right TM.     Nose: Nose normal.     Mouth/Throat:     Mouth: Mucous membranes are moist.     Pharynx: Oropharynx is clear.  Eyes:     Conjunctiva/sclera: Conjunctivae normal.  Cardiovascular:     Rate and Rhythm: Normal rate and regular rhythm.     Heart sounds: Normal heart sounds.  Pulmonary:     Effort: Pulmonary effort is normal. No respiratory distress.     Breath sounds: Normal breath sounds.  Abdominal:     Palpations: Abdomen is soft.     Tenderness: There is no abdominal tenderness.  Musculoskeletal:        General: No edema.     Cervical back: Neck supple.  Skin:    General: Skin is warm and dry.  Neurological:     General: No focal deficit present.     Mental Status: She is  alert and oriented to person, place, and time.     Gait: Gait normal.  Psychiatric:        Mood and Affect: Mood and affect and mood normal.        Behavior: Behavior normal.      UC Treatments / Results  Labs (all labs ordered are listed, but only abnormal results are displayed) Labs Reviewed - No data to display  EKG   Radiology No results found.  Procedures Procedures (including critical care time)  Medications Ordered in UC Medications - No data to display  Initial Impression / Assessment and Plan / UC Course  I have reviewed the triage vital signs and the nursing notes.  Pertinent labs & imaging results that were available during my care of the patient were reviewed by me and considered in my medical decision making (see chart for details).   Acute otitis externa of right ear. Treating with ofloxacin drops. Instructed her to take Tylenol or ibuprofen as needed for discomfort. Discussed that she should follow-up with her PCP if her symptoms are not improving. Patient agrees to plan of care.   Final Clinical Impressions(s) / UC Diagnoses   Final diagnoses:  Acute otitis externa of right ear, unspecified type     Discharge Instructions     Use the ear drops as directed.    Follow up with your primary care provider if your symptoms are not improving.        ED Prescriptions    Medication Sig Dispense Auth. Provider   ofloxacin (FLOXIN) 0.3 % OTIC solution Place 10 drops into the right ear daily. 5 mL Mickie Bail, NP     PDMP not reviewed this encounter.   Mickie Bail, NP 10/14/20 [phone]

## 2021-04-23 ENCOUNTER — Other Ambulatory Visit: Payer: Self-pay | Admitting: Internal Medicine

## 2021-04-23 DIAGNOSIS — Z1231 Encounter for screening mammogram for malignant neoplasm of breast: Secondary | ICD-10-CM

## 2023-05-02 ENCOUNTER — Encounter (HOSPITAL_COMMUNITY): Payer: Self-pay

## 2023-05-02 ENCOUNTER — Ambulatory Visit (HOSPITAL_COMMUNITY)
Admission: EM | Admit: 2023-05-02 | Discharge: 2023-05-02 | Disposition: A | Discharge status: Home / Self Care | Payer: BC Managed Care – PPO | Attending: Emergency Medicine | Admitting: Emergency Medicine

## 2023-05-02 DIAGNOSIS — N760 Acute vaginitis: Secondary | ICD-10-CM | POA: Diagnosis present

## 2023-05-02 DIAGNOSIS — N898 Other specified noninflammatory disorders of vagina: Secondary | ICD-10-CM | POA: Diagnosis present

## 2023-05-02 MED ORDER — TRIAMCINOLONE ACETONIDE 0.1 % EX CREA: 1.0000 | TOPICAL_CREAM | Freq: Two times a day (BID) | CUTANEOUS | 0 refills | Status: AC

## 2023-05-02 MED ORDER — FLUCONAZOLE 150 MG PO TABS: ORAL_TABLET | ORAL | 0 refills | Status: AC

## 2023-05-02 NOTE — Discharge Instructions (Addendum)
I am covering him today for a yeast infection.  The cytology swab should result over the next few days and we will contact you if we need to modify your treatment plan.  You can use the triamcinolone cream up to twice daily your vaginal area if your itching persist.   If your symptoms persist, I advise further evaluation of your primary care over the dedicated OB/GYN.  Return to clinic for any new or urgent symptoms.

## 2023-05-02 NOTE — ED Triage Notes (Signed)
Patient c/o intermittent vaginal itching x 3 weeks.  Patient reports that she used Vagisil with no relief.

## 2023-05-02 NOTE — ED Provider Notes (Addendum)
[facility]    CSN: [id number] Arrival date & time: 05/02/23  1221      History   Chief Complaint Chief Complaint  Patient presents with   Vaginal Itching    HPI Janice Shepherd is a 52 y.o. female.    Reports intermittent vaginal itching for the past three weeks. Itching is intense and usually she notices it after work, unsure if the heat is a factor. Has not tried any interventions  Denies dysuria, urinary odor, flank pain or fevers  Denies changes to vaginal discharge She scratches the area and afterwards it feels raw  Accompanied by husband. Denies concerns for STIs.    The history is provided by the patient and medical records.  Vaginal Itching    History reviewed. No pertinent past medical history.  There are no problems to display for this patient.   History reviewed. No pertinent surgical history.  OB History   No obstetric history on file.      Home Medications    Prior to Admission medications   Medication Sig Start Date End Date Taking? Authorizing Provider  fluconazole (DIFLUCAN) 150 MG tablet Please take 1 tablet today and another tablet in 72 hours if your vaginal itching persist. 05/02/23  Yes Rinaldo Ratel, [place] N, FNP  triamcinolone cream (KENALOG) 0.1 % Apply 1 Application topically 2 (two) times daily. To vaginal area if itching 05/02/23  Yes Alicen Donalson, [place], FNP    Family History History reviewed. No pertinent family history.  Social History Social History   Tobacco Use   Smoking status: Never   Smokeless tobacco: Never  Vaping Use   Vaping Use: Never used  Substance Use Topics   Alcohol use: No    Alcohol/week: 0.0 standard drinks of alcohol   Drug use: No     Allergies   Patient has no known allergies.   Review of Systems Review of Systems  Genitourinary:  Negative for dysuria, flank pain, genital sores and vaginal discharge.     Physical Exam Triage Vital Signs ED Triage Vitals  Enc Vitals Group     BP  05/02/23 1301 (!) 160/90     Pulse Rate 05/02/23 1301 (!) 125     Resp 05/02/23 1301 16     Temp 05/02/23 1301 98 F (36.7 C)     Temp Source 05/02/23 1301 Oral     SpO2 05/02/23 1301 98 %     Weight --      Height --      Head Circumference --      Peak Flow --      Pain Score 05/02/23 1303 0     Pain Loc --      Pain Edu? --      Excl. in [place]? --    No data found.  Updated Vital Signs BP (!) 160/90 (BP Location: Right Arm)   Pulse (!) 125   Temp 98 F (36.7 C) (Oral)   Resp 16   LMP 04/10/2023   SpO2 98%   Visual Acuity Right Eye Distance:   Left Eye Distance:   Bilateral Distance:    Right Eye Near:   Left Eye Near:    Bilateral Near:     Physical Exam Vitals and nursing note reviewed.  Constitutional:      Appearance: Normal appearance.  HENT:     Head: Normocephalic and atraumatic.     Right Ear: External ear normal.     Left Ear: External ear normal.  Nose: Nose normal.     Mouth/Throat:     Mouth: Mucous membranes are moist.  Eyes:     Conjunctiva/sclera: Conjunctivae normal.  Cardiovascular:     Rate and Rhythm: Normal rate.  Pulmonary:     Effort: Pulmonary effort is normal. No respiratory distress.  Musculoskeletal:        General: Normal range of motion.  Skin:    General: Skin is warm and dry.  Neurological:     General: No focal deficit present.     Mental Status: She is alert and oriented to person, place, and time.  Psychiatric:        Mood and Affect: Mood normal.      UC Treatments / Results  Labs (all labs ordered are listed, but only abnormal results are displayed) Labs Reviewed  CERVICOVAGINAL ANCILLARY ONLY    EKG   Radiology No results found.  Procedures Procedures (including critical care time)  Medications Ordered in UC Medications - No data to display  Initial Impression / Assessment and Plan / UC Course  I have reviewed the triage vital signs and the nursing notes.  Pertinent labs & imaging results  that were available during my care of the patient were reviewed by me and considered in my medical decision making (see chart for details).  Vitals and triage reviewed, patient is hemodynamically stable.  Intermittent vaginal itching without changes to discharge.  No sores or lesions.  Without dysuria.  Cytology swab obtained in clinic.  Will treat for yeast, symptoms are consistent with yeast vaginitis.  Will also cover for lichen sclerosus due to the intense and intermittent nature of itching with triamcinolone cream.  Encouraged dedicated follow-up with an OB/GYN for further evaluation.  Plan of care, follow-up care and return precautions given, no questions at this time.    Final Clinical Impressions(s) / UC Diagnoses   Final diagnoses:  Acute vaginitis  Vaginal itching     Discharge Instructions      I am covering him today for a yeast infection.  The cytology swab should result over the next few days and we will contact you if we need to modify your treatment plan.  You can use the triamcinolone cream up to twice daily your vaginal area if your itching persist.   If your symptoms persist, I advise further evaluation of your primary care over the dedicated OB/GYN.  Return to clinic for any new or urgent symptoms.     ED Prescriptions     Medication Sig Dispense Auth. Provider   fluconazole (DIFLUCAN) 150 MG tablet Please take 1 tablet today and another tablet in 72 hours if your vaginal itching persist. 2 tablet Brigitte Soderberg, [place], FNP   triamcinolone cream (KENALOG) 0.1 % Apply 1 Application topically 2 (two) times daily. To vaginal area if itching 30 g Marasia Newhall, [place], [place]      PDMP not reviewed this encounter.      Madalene Mickler, [place], [place] 05/02/23 1343

## 2023-05-05 LAB — CERVICOVAGINAL ANCILLARY ONLY
Bacterial Vaginitis (gardnerella): NEGATIVE
Candida Glabrata: NEGATIVE
Candida Vaginitis: NEGATIVE
Chlamydia: NEGATIVE
Comment: NEGATIVE
Comment: NEGATIVE
Comment: NEGATIVE
Comment: NEGATIVE
Comment: NEGATIVE
Comment: NORMAL
Neisseria Gonorrhea: NEGATIVE
Trichomonas: NEGATIVE

## 2023-05-12 ENCOUNTER — Telehealth (HOSPITAL_COMMUNITY): Payer: Self-pay | Admitting: Emergency Medicine

## 2023-05-12 NOTE — Telephone Encounter (Signed)
Patient called and identified herself, provided permission to review results with patient's husband Called patient's husband, he also provided identifying information and negative results provided Awaiting provider review, as husband had more questions

## 2023-05-12 NOTE — Telephone Encounter (Signed)
Received a voicemail from patient's husband requesting test results from recent visit.   Attempted to reach patient x 1, husband answered and states he is not with her and he translates for her due to her limited english.   Encouraged return call when patient is present, and we will get an interpreter, due to the nature of the testing, he verbalized understanding

## 2023-05-14 MED ORDER — HYDROXYZINE HCL 25 MG PO TABS: 25.0000 mg | ORAL_TABLET | Freq: Four times a day (QID) | ORAL | 0 refills | Status: AC

## 2023-05-14 NOTE — Telephone Encounter (Signed)
Per Bridgett Larsson, APP okay to send Hydroxyzine to assist with patient's itchiness until she can be seen by specialist.  Husband updated, all questions answered, prescription send

## 2023-12-25 ENCOUNTER — Encounter (HOSPITAL_COMMUNITY): Payer: Self-pay

## 2023-12-25 ENCOUNTER — Ambulatory Visit (HOSPITAL_COMMUNITY)
Admission: EM | Admit: 2023-12-25 | Discharge: 2023-12-25 | Disposition: A | Discharge status: Home / Self Care | Payer: 59 | Attending: Physician Assistant | Admitting: Physician Assistant

## 2023-12-25 DIAGNOSIS — J302 Other seasonal allergic rhinitis: Secondary | ICD-10-CM | POA: Diagnosis not present

## 2023-12-25 DIAGNOSIS — J0100 Acute maxillary sinusitis, unspecified: Secondary | ICD-10-CM | POA: Diagnosis not present

## 2023-12-25 MED ORDER — BENZONATATE 100 MG PO CAPS: 100.0000 mg | ORAL_CAPSULE | Freq: Three times a day (TID) | ORAL | 0 refills | Status: AC

## 2023-12-25 MED ORDER — AMOXICILLIN-POT CLAVULANATE 875-125 MG PO TABS: 1.0000 | ORAL_TABLET | Freq: Two times a day (BID) | ORAL | 0 refills | Status: AC

## 2023-12-25 MED ORDER — PREDNISONE 20 MG PO TABS: ORAL_TABLET | ORAL | 0 refills | Status: AC

## 2023-12-25 NOTE — ED Triage Notes (Signed)
 Patient c/o a cough and nasal congestion x 3 weeks.   Patient states she has been taking an OTC Mucus DM, allergy relief, and Advil.

## 2023-12-25 NOTE — Discharge Instructions (Addendum)
 Based on your symptoms and duration of illness, I believe you may have a bacterial sinus infection  These typically resolve with antibiotic therapy along with at-home comfort measures  Today I have sent in a prescription for  Augmentin 875-125 mg to be taken by mouth twice per day for 7 days FINISH THE ENTIRE COURSE unless you develop an allergic reaction or are instructed to discontinue.  It can take a few days for the antibiotic to kick in so I recommend symptomatic relief with over the counter medication such as the following: Dayquil/ Nyquil Theraflu Alkaseltzer   If you have high blood pressure I recommend that you take Mucinex, Robitussin, Tylenol instead of the combination medications.  Combination medications typically have a decongestant in them that can cause your blood pressure to be high.  These medications typically have Tylenol in them already so you do not need to supplement with more outside of those medications  Stay well hydrated with at least 75 oz of water per day to help with recovery  I have sent in a script for Prednisone taper to be taken in the morning with breakfast per the instructions on the container Remember that steroids can cause sleeplessness, irritability, increased hunger and elevated glucose levels so be mindful of these side effects. They should lessen as you progress to the lower doses of the taper.   If at any point you start to develop swelling around the eyes and nose, trouble seeing, fevers that are not responding to medications, trouble breathing, passing out or headaches that are very severe please go to the emergency room for further evaluation management  For recurrent runny nose associated with season changes, I recommend taking Claritin, Allegra or Zyrtec for your symptoms. Generics of these work just as well and are typically less expensive. You can also try using Flonase for assistance with these concerns too.

## 2023-12-25 NOTE — ED Provider Notes (Signed)
 [facility]    CSN: [id number] Arrival date & time: 12/25/23  1558      History   Chief Complaint Chief Complaint  Patient presents with   Cough   Nasal Congestion    HPI Janice Shepherd is a 53 y.o. female.   HPI  She reports she has been having scratchy throat, voice change, congestion, sinus pressure, postnasal drainage, ear fullness  She reports concerns for chest congestion and constant need to clear throat She states she has had some watery eyes and persistent dry coughing at this time  She has developed reduced appetite and odd smell in her nose that she associates with sinus infection   Patient presents with concerns for cough and nasal congestion for the past 3 weeks. She reports that she has tried taking over-the-counter Mucinex, allergy relief, Advil without relief.   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 No pertinent past medical history.  There are no active problems to display for this patient.   History reviewed. No pertinent surgical history.  OB History   No obstetric history on file.      Home Medications    Prior to Admission medications   Medication Sig Start Date End Date Taking? Authorizing Provider  amoxicillin-clavulanate (AUGMENTIN) 875-125 MG tablet Take 1 tablet by mouth every 12 (twelve) hours. 12/25/23  Yes Vihan Santagata E, PA-C  benzonatate (TESSALON) 100 MG capsule Take 1 capsule (100 mg total) by mouth every 8 (eight) hours. 12/25/23  Yes Ariyon Mittleman E, PA-C  predniSONE (DELTASONE) 20 MG tablet Take 60mg  PO daily x 2 days, then40mg  PO daily x 2 days, then 20mg  PO daily x 3 days 12/25/23  Yes Lanaiya Lantry E, PA-C  fluconazole (DIFLUCAN) 150 MG tablet Please take 1 tablet today and another tablet in 72 hours if your vaginal itching persist. 05/02/23   Garrison, [place] N, FNP  hydrOXYzine (ATARAX) 25 MG tablet Take 1 tablet (25 mg total) by mouth every 6  (six) hours. 05/14/23   LampteyBritta Mccreedy, MD  triamcinolone cream (KENALOG) 0.1 % Apply 1 Application topically 2 (two) times daily. To vaginal area if itching 05/02/23   Garrison, [place] N, FNP    Family History History reviewed. No pertinent family history.  Social History Social History   Tobacco Use   Smoking status: Never   Smokeless tobacco: Never  Vaping Use   Vaping status: Never Used  Substance Use Topics   Alcohol use: No    Alcohol/week: 0.0 standard drinks of alcohol   Drug use: No     Allergies   Patient has no known allergies.   Review of Systems Review of Systems  Constitutional:  Negative for appetite change, chills and fever.  HENT:  Positive for congestion, postnasal drip, sinus pressure and sore throat. Negative for ear pain.   Respiratory:  Positive for cough. Negative for shortness of breath and wheezing.      Physical Exam Triage Vital Signs ED Triage Vitals  Encounter Vitals Group     BP 12/25/23 1653 (!) 170/84     Systolic BP Percentile --      Diastolic BP Percentile --      Pulse Rate 12/25/23 1653 (!) 102     Resp 12/25/23 1653 14     Temp 12/25/23 1653 98.3 F (36.8 C)     Temp Source 12/25/23 1653 Oral     SpO2 12/25/23  1653 98 %     Weight --      Height --      Head Circumference --      Peak Flow --      Pain Score 12/25/23 1656 0     Pain Loc --      Pain Education --      Exclude from Growth Chart --    No data found.  Updated Vital Signs BP (!) 170/84 (BP Location: Right Arm)   Pulse (!) 102   Temp 98.3 F (36.8 C) (Oral)   Resp 14   LMP 12/25/2023   SpO2 98%   Visual Acuity Right Eye Distance:   Left Eye Distance:   Bilateral Distance:    Right Eye Near:   Left Eye Near:    Bilateral Near:     Physical Exam Vitals reviewed.  Constitutional:      General: She is awake.     Appearance: Normal appearance. She is well-developed and well-groomed.  HENT:     Head: Normocephalic and atraumatic.     Right  Ear: Hearing, tympanic membrane and ear canal normal.     Left Ear: Hearing, tympanic membrane and ear canal normal.     Mouth/Throat:     Lips: Pink.     Mouth: Mucous membranes are moist.     Pharynx: Oropharynx is clear. Uvula midline. No pharyngeal swelling, oropharyngeal exudate, posterior oropharyngeal erythema, uvula swelling or postnasal drip.  Cardiovascular:     Rate and Rhythm: Normal rate and regular rhythm.     Heart sounds: Normal heart sounds. No murmur heard.    No friction rub. No gallop.  Pulmonary:     Effort: Pulmonary effort is normal.     Breath sounds: Normal breath sounds. No decreased air movement. No decreased breath sounds, wheezing, rhonchi or rales.  Musculoskeletal:     Cervical back: Normal range of motion and neck supple.  Lymphadenopathy:     Head:     Right side of head: No submental, submandibular or preauricular adenopathy.     Left side of head: No submental, submandibular or preauricular adenopathy.     Cervical:     Right cervical: No superficial cervical adenopathy.    Left cervical: No superficial cervical adenopathy.     Upper Body:     Right upper body: No supraclavicular adenopathy.     Left upper body: No supraclavicular adenopathy.  Neurological:     General: No focal deficit present.     Mental Status: She is alert and oriented to person, place, and time.     GCS: GCS eye subscore is 4. GCS verbal subscore is 5. GCS motor subscore is 6.  Psychiatric:        Attention and Perception: Attention and perception normal.        Mood and Affect: Mood and affect normal.        Speech: Speech normal.        Behavior: Behavior normal. Behavior is cooperative.      UC Treatments / Results  Labs (all labs ordered are listed, but only abnormal results are displayed) Labs Reviewed - No data to display  EKG   Radiology No results found.  Procedures Procedures (including critical care time)  Medications Ordered in UC Medications - No  data to display  Initial Impression / Assessment and Plan / UC Course  I have reviewed the triage vital signs and the nursing notes.  Pertinent labs &  imaging results that were available during my care of the patient were reviewed by me and considered in my medical decision making (see chart for details).      Final Clinical Impressions(s) / UC Diagnoses   Final diagnoses:  Acute maxillary sinusitis, recurrence not specified  Seasonal allergies   Patient presents today with concerns for ongoing sinus congestion, sinus pressure, coughing, chest congestion.  Physical exam is reassuring in terms of pneumonia given appropriate oxygen saturation and clear lung sounds.  Pulse and blood pressure are mildly elevated but I suspect this likely secondary to feeling sick.  She has tried several medications over-the-counter for symptomatic relief that have not provided resolution.  At this time I suspect she likely has acute maxillary sinusitis.  Will send in course of Augmentin p.o. twice daily x 7 days.  Will also send in prednisone taper to assist with concerns for chest congestion and recurrent cough.  Patient is requesting a cough medication so we will send in Tessalon Perles to assist with this.  Also recommend symptomatic management with over-the-counter medications such as Robitussin, Mucinex, Tylenol.  Reviewed that for her recurrent seasonal allergies she should start a second-generation antihistamine such as Claritin, Allegra or Zyrtec.  She can also use Flonase as desired.  ED and return precautions reviewed and provided in after visit summary.  Follow-up as needed for progressing or persistent symptoms    Discharge Instructions      Based on your symptoms and duration of illness, I believe you may have a bacterial sinus infection  These typically resolve with antibiotic therapy along with at-home comfort measures  Today I have sent in a prescription for  Augmentin 875-125 mg to be taken by  mouth twice per day for 7 days FINISH THE ENTIRE COURSE unless you develop an allergic reaction or are instructed to discontinue.  It can take a few days for the antibiotic to kick in so I recommend symptomatic relief with over the counter medication such as the following: Dayquil/ Nyquil Theraflu Alkaseltzer   If you have high blood pressure I recommend that you take Mucinex, Robitussin, Tylenol instead of the combination medications.  Combination medications typically have a decongestant in them that can cause your blood pressure to be high.  These medications typically have Tylenol in them already so you do not need to supplement with more outside of those medications  Stay well hydrated with at least 75 oz of water per day to help with recovery  I have sent in a script for Prednisone taper to be taken in the morning with breakfast per the instructions on the container Remember that steroids can cause sleeplessness, irritability, increased hunger and elevated glucose levels so be mindful of these side effects. They should lessen as you progress to the lower doses of the taper.   If at any point you start to develop swelling around the eyes and nose, trouble seeing, fevers that are not responding to medications, trouble breathing, passing out or headaches that are very severe please go to the emergency room for further evaluation management  For recurrent runny nose associated with season changes, I recommend taking Claritin, Allegra or Zyrtec for your symptoms. Generics of these work just as well and are typically less expensive. You can also try using Flonase for assistance with these concerns too.      ED Prescriptions     Medication Sig Dispense Auth. Provider   amoxicillin-clavulanate (AUGMENTIN) 875-125 MG tablet Take 1 tablet by mouth every  12 (twelve) hours. 14 tablet Malcomb Gangemi E, PA-C   predniSONE (DELTASONE) 20 MG tablet Take 60mg  PO daily x 2 days, then40mg  PO daily x 2  days, then 20mg  PO daily x 3 days 13 tablet Avan Gullett E, PA-C   benzonatate (TESSALON) 100 MG capsule Take 1 capsule (100 mg total) by mouth every 8 (eight) hours. 21 capsule Shron Ozer E, PA-C      PDMP not reviewed this encounter.   Providence Crosby, PA-C 12/25/23 1750

## 2024-02-18 ENCOUNTER — Ambulatory Visit: Admitting: Physician Assistant

## 2024-05-04 ENCOUNTER — Ambulatory Visit: Admitting: Family Medicine
